# Patient Record
Sex: Male | Born: 1980
Health system: Southern US, Community
[De-identification: ages and names within clinical notes are randomized; demographics above are authoritative.]

---

## 2017-01-21 ENCOUNTER — Telehealth: Payer: Self-pay | Admitting: Family

## 2017-01-21 DIAGNOSIS — R6889 Other general symptoms and signs: Secondary | ICD-10-CM

## 2017-01-21 MED ORDER — OSELTAMIVIR PHOSPHATE 75 MG PO CAPS: 75.0000 mg | ORAL_CAPSULE | Freq: Two times a day (BID) | ORAL | 0 refills | Status: AC

## 2017-01-21 NOTE — Progress Notes (Signed)

## 2017-05-08 ENCOUNTER — Telehealth: Payer: Self-pay | Admitting: Family

## 2017-05-08 DIAGNOSIS — J029 Acute pharyngitis, unspecified: Secondary | ICD-10-CM

## 2017-05-08 MED ORDER — DOXYCYCLINE HYCLATE 100 MG PO TABS: 100.0000 mg | ORAL_TABLET | Freq: Two times a day (BID) | ORAL | 0 refills | Status: AC

## 2017-05-08 NOTE — Progress Notes (Signed)
We are sorry that you are not feeling well.  Here is how we plan to help!  Based on what you have shared with me it looks like you have pharyngitis.  Pharyngitis is inflammation and infection in the throat.  Based on your presentation I believe you most likely have Pharyngitis.  This is an infection caused by bacteria and is treated with antibiotics. I have prescribed Doxycycline 100mg  by mouth twice a day for 10 days. You may use an oral decongestant such as Mucinex D or if you have glaucoma or high blood pressure use plain Mucinex. Saline nasal spray help and can safely be used as often as needed for congestion.  If you develop worsening sinus pain, fever or notice severe headache and vision changes, or if symptoms are not better after completion of antibiotic, please schedule an appointment with a health care provider.    Remember to wash your hands thoroughly throughout the day as this is the number one way to prevent the spread of infection!  Home Care:  Only take medications as instructed by your medical team.  Complete the entire course of an antibiotic.  Do not take these medications with alcohol.  A steam or ultrasonic humidifier can help congestion.  You can place a towel over your head and breathe in the steam from hot water coming from a faucet.  Avoid close contacts especially the very young and the elderly.  Cover your mouth when you cough or sneeze.  Always remember to wash your hands.  Get Help Right Away If:  You develop worsening fever or sinus pain.  You develop a severe head ache or visual changes.  Your symptoms persist after you have completed your treatment plan.  Make sure you  Understand these instructions.  Will watch your condition.  Will get help right away if you are not doing well or get worse.  Your e-visit answers were reviewed by a board certified advanced clinical practitioner to complete your personal care plan.  Depending on the condition,  your plan could have included both over the counter or prescription medications.  If there is a problem please reply  once you have received a response from your provider.  Your safety is important to us.  If you have drug allergies check your prescription carefully.    You can use MyChart to ask questions about today's visit, request a non-urgent call back, or ask for a work or school excuse for 24 hours related to this e-Visit. If it has been greater than 24 hours you will need to follow up with your provider, or enter a new e-Visit to address those concerns.  You will get an e-mail in the next two days asking about your experience.  I hope that your e-visit has been valuable and will speed your recovery. Thank you for using e-visits.

## 2017-05-20 DIAGNOSIS — R070 Pain in throat: Secondary | ICD-10-CM | POA: Diagnosis not present

## 2017-05-20 DIAGNOSIS — J019 Acute sinusitis, unspecified: Secondary | ICD-10-CM | POA: Diagnosis not present

## 2017-08-27 DIAGNOSIS — R509 Fever, unspecified: Secondary | ICD-10-CM | POA: Diagnosis not present

## 2017-08-28 DIAGNOSIS — N410 Acute prostatitis: Secondary | ICD-10-CM | POA: Diagnosis not present

## 2017-08-28 DIAGNOSIS — R509 Fever, unspecified: Secondary | ICD-10-CM | POA: Diagnosis not present

## 2017-08-28 DIAGNOSIS — D72829 Elevated white blood cell count, unspecified: Secondary | ICD-10-CM | POA: Diagnosis not present

## 2017-09-04 DIAGNOSIS — D72829 Elevated white blood cell count, unspecified: Secondary | ICD-10-CM | POA: Diagnosis not present

## 2017-09-20 DIAGNOSIS — N410 Acute prostatitis: Secondary | ICD-10-CM | POA: Diagnosis not present

## 2018-05-08 DIAGNOSIS — R300 Dysuria: Secondary | ICD-10-CM | POA: Diagnosis not present

## 2018-05-08 DIAGNOSIS — N529 Male erectile dysfunction, unspecified: Secondary | ICD-10-CM | POA: Diagnosis not present

## 2018-05-08 DIAGNOSIS — R5383 Other fatigue: Secondary | ICD-10-CM | POA: Diagnosis not present

## 2018-05-17 ENCOUNTER — Telehealth: Payer: Self-pay | Admitting: Family

## 2018-05-17 DIAGNOSIS — B9689 Other specified bacterial agents as the cause of diseases classified elsewhere: Secondary | ICD-10-CM

## 2018-05-17 DIAGNOSIS — J028 Acute pharyngitis due to other specified organisms: Secondary | ICD-10-CM

## 2018-05-17 MED ORDER — AZITHROMYCIN 250 MG PO TABS: ORAL_TABLET | ORAL | 0 refills | Status: AC

## 2018-05-17 MED ORDER — BENZONATATE 100 MG PO CAPS: 100.0000 mg | ORAL_CAPSULE | Freq: Three times a day (TID) | ORAL | 0 refills | Status: AC | PRN

## 2018-05-17 MED ORDER — PREDNISONE 5 MG PO TABS: 5.0000 mg | ORAL_TABLET | ORAL | 0 refills | Status: AC

## 2018-05-17 NOTE — Progress Notes (Signed)
Thank you for the details you included in the comment boxes. Those details are very helpful in determining the best course of treatment for you and help [place] to provide the best care. Please stop using sinus rinses; these cause infections and worsen them. As far as being at work, the general rule is that you can go to work unless your fever is over 101.4 (approximately) and that you return to work 24 hours after the fever breaks. If your fever is below that, there is no guideline and it is really based on how you feel. Besides the courtesy of not coughing on others and perhaps wearing a mask, there is no reason to miss work unless you feel you cannot perform your job due to your illness. If that is the case, let us know and we can write you a work note.  We are sorry that you are not feeling well.  Here is how we plan to help!  Based on your presentation I believe you most likely have A cough due to bacteria.  When patients have a fever and a productive cough with a change in color or increased sputum production, we are concerned about bacterial bronchitis.  If left untreated it can progress to pneumonia.  If your symptoms do not improve with your treatment plan it is important that you contact your provider.   I have prescribed Azithromyin 250 mg: two tablets now and then one tablet daily for 4 additonal days    In addition you may use A non-prescription cough medication called Mucinex DM: take 2 tablets every 12 hours. and A prescription cough medication called Tessalon Perles 100mg . You may take 1-2 capsules every 8 hours as needed for your cough.  Prednisone 5 mg daily for 6 days (see taper instructions below)  Directions for 6 day taper: Day 1: 2 tablets before breakfast, 1 after both lunch & dinner and 2 at bedtime Day 2: 1 tab before breakfast, 1 after both lunch & dinner and 2 at bedtime Day 3: 1 tab at each meal & 1 at bedtime Day 4: 1 tab at breakfast, 1 at lunch, 1 at bedtime Day 5: 1 tab at  breakfast & 1 tab at bedtime Day 6: 1 tab at breakfast   From your responses in the eVisit questionnaire you describe inflammation in the upper respiratory tract which is causing a significant cough.  This is commonly called Bronchitis and has four common causes:    Allergies  Viral Infections  Acid Reflux  Bacterial Infection Allergies, viruses and acid reflux are treated by controlling symptoms or eliminating the cause. An example might be a cough caused by taking certain blood pressure medications. You stop the cough by changing the medication. Another example might be a cough caused by acid reflux. Controlling the reflux helps control the cough.  USE OF BRONCHODILATOR ("RESCUE") INHALERS: There is a risk from using your bronchodilator too frequently.  The risk is that over-reliance on a medication which only relaxes the muscles surrounding the breathing tubes can reduce the effectiveness of medications prescribed to reduce swelling and congestion of the tubes themselves.  Although you feel brief relief from the bronchodilator inhaler, your asthma may actually be worsening with the tubes becoming more swollen and filled with mucus.  This can delay other crucial treatments, such as oral steroid medications. If you need to use a bronchodilator inhaler daily, several times per day, you should discuss this with your provider.  There are probably better treatments  that could be used to keep your asthma under control.     HOME CARE . Only take medications as instructed by your medical team. . Complete the entire course of an antibiotic. . Drink plenty of fluids and get plenty of rest. . Avoid close contacts especially the very young and the elderly . Cover your mouth if you cough or cough into your sleeve. . Always remember to wash your hands . A steam or ultrasonic humidifier can help congestion.   GET HELP RIGHT AWAY IF: . You develop worsening fever. . You become short of breath . You  cough up blood. . Your symptoms persist after you have completed your treatment plan MAKE SURE YOU   Understand these instructions.  Will watch your condition.  Will get help right away if you are not doing well or get worse.  Your e-visit answers were reviewed by a board certified advanced clinical practitioner to complete your personal care plan.  Depending on the condition, your plan could have included both over the counter or prescription medications. If there is a problem please reply  once you have received a response from your provider. Your safety is important to us.  If you have drug allergies check your prescription carefully.    You can use MyChart to ask questions about today's visit, request a non-urgent call back, or ask for a work or school excuse for 24 hours related to this e-Visit. If it has been greater than 24 hours you will need to follow up with your provider, or enter a new e-Visit to address those concerns. You will get an e-mail in the next two days asking about your experience.  I hope that your e-visit has been valuable and will speed your recovery. Thank you for using e-visits.

## 2018-05-22 DIAGNOSIS — R748 Abnormal levels of other serum enzymes: Secondary | ICD-10-CM | POA: Diagnosis not present

## 2018-06-26 DIAGNOSIS — Z0000 Encounter for general adult medical examination without abnormal findings: Secondary | ICD-10-CM | POA: Diagnosis not present

## 2018-07-22 DIAGNOSIS — E7800 Pure hypercholesterolemia, unspecified: Secondary | ICD-10-CM | POA: Diagnosis not present

## 2018-07-22 DIAGNOSIS — Z0000 Encounter for general adult medical examination without abnormal findings: Secondary | ICD-10-CM | POA: Diagnosis not present

## 2018-11-12 DIAGNOSIS — M542 Cervicalgia: Secondary | ICD-10-CM | POA: Diagnosis not present

## 2018-11-12 DIAGNOSIS — J309 Allergic rhinitis, unspecified: Secondary | ICD-10-CM | POA: Diagnosis not present

## 2018-12-05 DIAGNOSIS — M542 Cervicalgia: Secondary | ICD-10-CM | POA: Diagnosis not present

## 2018-12-24 ENCOUNTER — Other Ambulatory Visit: Payer: Self-pay

## 2018-12-24 ENCOUNTER — Ambulatory Visit: Payer: BLUE CROSS/BLUE SHIELD | Attending: Orthopedic Surgery | Admitting: Physical Therapy

## 2018-12-24 ENCOUNTER — Encounter: Payer: Self-pay | Admitting: Physical Therapy

## 2018-12-24 DIAGNOSIS — M5412 Radiculopathy, cervical region: Secondary | ICD-10-CM

## 2018-12-24 DIAGNOSIS — M542 Cervicalgia: Secondary | ICD-10-CM

## 2018-12-24 NOTE — Therapy (Signed)
[facility] [address] [place], [place], [address] Phone: [phone]   Fax:  [phone]  Physical Therapy Evaluation  Patient Details  Name: Jeff KelchRobert A Guzman MRN: [id number] Date of Birth: 1981-06-11 Referring Provider (PT): Angelyn PuntNorris   Encounter Date: 12/24/2018  PT End of Session - 12/24/18 0929    Visit Number  1    Date for PT Re-Evaluation  02/22/19    PT Start Time  0847    PT Stop Time  0940    PT Time Calculation (min)  53 min    Activity Tolerance  Patient tolerated treatment well    Behavior During Therapy  [facility] for tasks assessed/performed       History reviewed. No pertinent past medical history.  History reviewed. No pertinent surgical history.  There were no vitals filed for this visit.   Subjective Assessment - 12/24/18 0856    Subjective  Patient reports that he has had some neck pain off and on but over the past 4 months he reports that he has had increased pain and pain in the left arm is new.  X-rays show DDD and bone spurs.    Limitations  Lifting;Sitting;Reading    Patient Stated Goals  have less pain    Currently in Pain?  Yes    Pain Score  4     Pain Location  Neck    Pain Orientation  Left    Pain Descriptors / Indicators  Aching;Tightness;Pins and needles;Sharp    Pain Type  Acute pain    Pain Radiating Towards  pain, numbness into the firts 3 fingers    Pain Onset  More than a month ago    Pain Frequency  Constant    Aggravating Factors   reaching, lifting head motions, pain up to 8-9/10    Pain Relieving Factors  tramadol, massage, reports that he sees a chiropractor at best pain a 3-4/10    Effect of Pain on Daily Activities  just hurts doing anything         [facility] - 12/24/18 0001      Assessment   Medical Diagnosis  cervical radiculopathy    Referring Provider (PT)  Norris    Onset Date/Surgical Date  11/23/18    Hand Dominance  Right      Precautions   Precautions  None      Balance Screen   Has the patient fallen in the past 6 months  No    Has the patient had a decrease in activity level because of a fear of falling?   No    Is the patient reluctant to leave their home because of a fear of falling?   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, some computer work    Leisure  was going to the gym 1-2x/week      Posture/Postural Control   Posture Comments  fwd head      ROM / Strength   AROM / PROM / Strength  AROM;Strength      AROM   Overall AROM Comments  Cervical ROM decreased 25% for flexion, decreased 75% for extension with increased  pain down the left arm, cervical rotaiton decreased 50% with c/o tightness, side bending decreased 25%, shoulder ROM WFL's      Strength   Overall Strength Comments  5/5 without pain      Palpation   Palpation comment  tender and tight with knots in the left cervical area, the left upper trap, he is tender in the left forearm as well      Special Tests   Other special tests  some neural tension in the bilateral UE's                Objective measurements completed on examination: See above findings.      [facility] Adult PT Treatment/Exercise - 12/24/18 0001      Modalities   Modalities  Traction      Traction   Type of Traction  Cervical    Max (lbs)  16    Hold Time  static    Time  14             PT Education - 12/24/18 1348    Education Details  spoke with him about posture    Person(s) Educated  Patient    Methods  Explanation    Comprehension  Verbalized understanding       PT Short Term Goals - 12/24/18 1350      PT SHORT TERM GOAL #1   Title  independent with initial HEP    Time  2    Period  Weeks    Status   New        PT Long Term Goals - 12/24/18 1350      PT LONG TERM GOAL #1   Title  decrease pain 50%    Time  8    Period  Weeks    Status  New      PT LONG TERM GOAL #2   Title  increase ROM 25%    Time  8    Period  Weeks    Status  New      PT LONG TERM GOAL #3   Title  decrease radicular symptoms 25%    Time  8    Period  Weeks    Status  New      PT LONG TERM GOAL #4   Title  understand posture and body mechanics    Time  8    Period  Weeks    Status  New             Plan - 12/24/18 0930    Clinical Impression Statement  Patient with neck pain for a number of years, he reports that over the past 4-6 months he has had increased pain and now radicular symptoms down the left arm.  X-rays show DDD and bone spurs.  He has limitation in cervical ROM especially extension which increased neck pain and left arm pain.    Clinical Presentation  Evolving    Clinical Decision Making  Low    Rehab Potential  Good    PT Frequency  2x / week    PT Duration  8 weeks    PT Treatment/Interventions  ADLs/Self Care Home Management;Cryotherapy;Electrical Stimulation;Moist Heat;Traction;Ultrasound;Therapeutic activities;Therapeutic exercise;Neuromuscular re-education;Patient/family education;Manual techniques;Dry needling    PT Next Visit Plan  start scapular stabilization, could try modalities.  see how traction did    Consulted and Agree with Plan of Care  Patient       Patient will benefit from skilled therapeutic intervention  in order to improve the following deficits and impairments:  Improper body mechanics, Pain, Postural dysfunction, Increased muscle spasms, Decreased range of motion, Impaired UE functional use, Impaired flexibility  Visit Diagnosis: Radiculopathy, cervical region - Plan: PT plan of care cert/re-cert  Cervicalgia - Plan: PT plan of care cert/re-cert     Problem List There are no active problems to display for this patient.   Jearld LeschALBRIGHT,[name],  PT 12/24/2018, 2:00 PM  [facility] [address] [place], [place], [address] Phone: [phone]   Fax:  [phone]  Name: Jeff Guzman MRN: [id number] Date of Birth: Apr 26, 1981

## 2019-01-03 ENCOUNTER — Ambulatory Visit: Payer: BLUE CROSS/BLUE SHIELD | Admitting: Physical Therapy

## 2019-01-08 DIAGNOSIS — M9901 Segmental and somatic dysfunction of cervical region: Secondary | ICD-10-CM | POA: Diagnosis not present

## 2019-01-08 DIAGNOSIS — M4802 Spinal stenosis, cervical region: Secondary | ICD-10-CM | POA: Diagnosis not present

## 2019-11-27 DIAGNOSIS — Z20828 Contact with and (suspected) exposure to other viral communicable diseases: Secondary | ICD-10-CM | POA: Diagnosis not present

## 2020-01-06 DIAGNOSIS — Z0000 Encounter for general adult medical examination without abnormal findings: Secondary | ICD-10-CM | POA: Diagnosis not present

## 2020-01-06 DIAGNOSIS — K219 Gastro-esophageal reflux disease without esophagitis: Secondary | ICD-10-CM | POA: Diagnosis not present

## 2020-01-06 DIAGNOSIS — E7800 Pure hypercholesterolemia, unspecified: Secondary | ICD-10-CM | POA: Diagnosis not present

## 2020-01-07 DIAGNOSIS — Z125 Encounter for screening for malignant neoplasm of prostate: Secondary | ICD-10-CM | POA: Diagnosis not present

## 2020-01-07 DIAGNOSIS — I10 Essential (primary) hypertension: Secondary | ICD-10-CM | POA: Diagnosis not present

## 2020-01-07 DIAGNOSIS — N410 Acute prostatitis: Secondary | ICD-10-CM | POA: Diagnosis not present

## 2020-01-07 DIAGNOSIS — E7800 Pure hypercholesterolemia, unspecified: Secondary | ICD-10-CM | POA: Diagnosis not present

## 2020-01-14 DIAGNOSIS — M7541 Impingement syndrome of right shoulder: Secondary | ICD-10-CM | POA: Diagnosis not present

## 2020-01-14 DIAGNOSIS — M25511 Pain in right shoulder: Secondary | ICD-10-CM | POA: Diagnosis not present

## 2020-02-17 DIAGNOSIS — F329 Major depressive disorder, single episode, unspecified: Secondary | ICD-10-CM | POA: Diagnosis not present

## 2020-02-17 DIAGNOSIS — I10 Essential (primary) hypertension: Secondary | ICD-10-CM | POA: Diagnosis not present

## 2021-03-24 DIAGNOSIS — J301 Allergic rhinitis due to pollen: Secondary | ICD-10-CM | POA: Diagnosis not present

## 2021-03-24 DIAGNOSIS — L501 Idiopathic urticaria: Secondary | ICD-10-CM | POA: Diagnosis not present

## 2021-04-06 DIAGNOSIS — Z0000 Encounter for general adult medical examination without abnormal findings: Secondary | ICD-10-CM | POA: Diagnosis not present

## 2021-04-13 DIAGNOSIS — I10 Essential (primary) hypertension: Secondary | ICD-10-CM | POA: Diagnosis not present

## 2021-04-13 DIAGNOSIS — E7800 Pure hypercholesterolemia, unspecified: Secondary | ICD-10-CM | POA: Diagnosis not present

## 2021-04-13 DIAGNOSIS — Z0000 Encounter for general adult medical examination without abnormal findings: Secondary | ICD-10-CM | POA: Diagnosis not present

## 2021-04-13 DIAGNOSIS — K219 Gastro-esophageal reflux disease without esophagitis: Secondary | ICD-10-CM | POA: Diagnosis not present

## 2021-04-13 DIAGNOSIS — F339 Major depressive disorder, recurrent, unspecified: Secondary | ICD-10-CM | POA: Diagnosis not present

## 2021-04-14 ENCOUNTER — Other Ambulatory Visit: Payer: Self-pay | Admitting: Internal Medicine

## 2021-04-14 DIAGNOSIS — E7800 Pure hypercholesterolemia, unspecified: Secondary | ICD-10-CM

## 2021-05-04 DIAGNOSIS — J301 Allergic rhinitis due to pollen: Secondary | ICD-10-CM | POA: Diagnosis not present

## 2021-05-05 ENCOUNTER — Ambulatory Visit
Admission: RE | Admit: 2021-05-05 | Discharge: 2021-05-05 | Disposition: A | Discharge status: Home / Self Care | Payer: 59 | Source: Ambulatory Visit | Attending: Internal Medicine | Admitting: Internal Medicine

## 2021-05-05 DIAGNOSIS — E7800 Pure hypercholesterolemia, unspecified: Secondary | ICD-10-CM

## 2021-05-06 DIAGNOSIS — Z20822 Contact with and (suspected) exposure to covid-19: Secondary | ICD-10-CM | POA: Diagnosis not present

## 2021-05-18 DIAGNOSIS — J301 Allergic rhinitis due to pollen: Secondary | ICD-10-CM | POA: Diagnosis not present

## 2021-05-18 DIAGNOSIS — I10 Essential (primary) hypertension: Secondary | ICD-10-CM | POA: Diagnosis not present

## 2021-05-18 DIAGNOSIS — J8410 Pulmonary fibrosis, unspecified: Secondary | ICD-10-CM | POA: Diagnosis not present

## 2021-05-18 DIAGNOSIS — Z789 Other specified health status: Secondary | ICD-10-CM | POA: Diagnosis not present

## 2021-05-25 DIAGNOSIS — J301 Allergic rhinitis due to pollen: Secondary | ICD-10-CM | POA: Diagnosis not present

## 2021-06-01 DIAGNOSIS — J301 Allergic rhinitis due to pollen: Secondary | ICD-10-CM | POA: Diagnosis not present

## 2021-06-01 DIAGNOSIS — J3089 Other allergic rhinitis: Secondary | ICD-10-CM | POA: Diagnosis not present

## 2021-06-08 DIAGNOSIS — J301 Allergic rhinitis due to pollen: Secondary | ICD-10-CM | POA: Diagnosis not present

## 2021-06-17 DIAGNOSIS — J301 Allergic rhinitis due to pollen: Secondary | ICD-10-CM | POA: Diagnosis not present

## 2021-06-21 DIAGNOSIS — J301 Allergic rhinitis due to pollen: Secondary | ICD-10-CM | POA: Diagnosis not present

## 2021-06-21 DIAGNOSIS — J3089 Other allergic rhinitis: Secondary | ICD-10-CM | POA: Diagnosis not present

## 2021-07-01 DIAGNOSIS — J301 Allergic rhinitis due to pollen: Secondary | ICD-10-CM | POA: Diagnosis not present

## 2021-07-07 DIAGNOSIS — J301 Allergic rhinitis due to pollen: Secondary | ICD-10-CM | POA: Diagnosis not present

## 2021-07-12 DIAGNOSIS — J301 Allergic rhinitis due to pollen: Secondary | ICD-10-CM | POA: Diagnosis not present

## 2021-07-14 DIAGNOSIS — J301 Allergic rhinitis due to pollen: Secondary | ICD-10-CM | POA: Diagnosis not present

## 2021-07-22 DIAGNOSIS — J301 Allergic rhinitis due to pollen: Secondary | ICD-10-CM | POA: Diagnosis not present

## 2021-07-27 DIAGNOSIS — J301 Allergic rhinitis due to pollen: Secondary | ICD-10-CM | POA: Diagnosis not present

## 2021-08-03 DIAGNOSIS — J029 Acute pharyngitis, unspecified: Secondary | ICD-10-CM | POA: Diagnosis not present

## 2021-08-03 DIAGNOSIS — R059 Cough, unspecified: Secondary | ICD-10-CM | POA: Diagnosis not present

## 2021-08-03 DIAGNOSIS — Z20822 Contact with and (suspected) exposure to covid-19: Secondary | ICD-10-CM | POA: Diagnosis not present

## 2021-08-04 DIAGNOSIS — R519 Headache, unspecified: Secondary | ICD-10-CM | POA: Diagnosis not present

## 2021-08-04 DIAGNOSIS — M9902 Segmental and somatic dysfunction of thoracic region: Secondary | ICD-10-CM | POA: Diagnosis not present

## 2021-08-04 DIAGNOSIS — M6210 Other rupture of muscle (nontraumatic), unspecified site: Secondary | ICD-10-CM | POA: Diagnosis not present

## 2021-08-04 DIAGNOSIS — J301 Allergic rhinitis due to pollen: Secondary | ICD-10-CM | POA: Diagnosis not present

## 2021-08-04 DIAGNOSIS — M9901 Segmental and somatic dysfunction of cervical region: Secondary | ICD-10-CM | POA: Diagnosis not present

## 2021-08-10 DIAGNOSIS — M9901 Segmental and somatic dysfunction of cervical region: Secondary | ICD-10-CM | POA: Diagnosis not present

## 2021-08-10 DIAGNOSIS — J301 Allergic rhinitis due to pollen: Secondary | ICD-10-CM | POA: Diagnosis not present

## 2021-08-10 DIAGNOSIS — R519 Headache, unspecified: Secondary | ICD-10-CM | POA: Diagnosis not present

## 2021-08-10 DIAGNOSIS — M6210 Other rupture of muscle (nontraumatic), unspecified site: Secondary | ICD-10-CM | POA: Diagnosis not present

## 2021-08-10 DIAGNOSIS — M9902 Segmental and somatic dysfunction of thoracic region: Secondary | ICD-10-CM | POA: Diagnosis not present

## 2021-08-15 DIAGNOSIS — J301 Allergic rhinitis due to pollen: Secondary | ICD-10-CM | POA: Diagnosis not present

## 2021-08-16 DIAGNOSIS — R519 Headache, unspecified: Secondary | ICD-10-CM | POA: Diagnosis not present

## 2021-08-16 DIAGNOSIS — M6210 Other rupture of muscle (nontraumatic), unspecified site: Secondary | ICD-10-CM | POA: Diagnosis not present

## 2021-08-16 DIAGNOSIS — M9901 Segmental and somatic dysfunction of cervical region: Secondary | ICD-10-CM | POA: Diagnosis not present

## 2021-08-16 DIAGNOSIS — M9902 Segmental and somatic dysfunction of thoracic region: Secondary | ICD-10-CM | POA: Diagnosis not present

## 2021-08-19 DIAGNOSIS — J301 Allergic rhinitis due to pollen: Secondary | ICD-10-CM | POA: Diagnosis not present

## 2021-08-25 DIAGNOSIS — M9902 Segmental and somatic dysfunction of thoracic region: Secondary | ICD-10-CM | POA: Diagnosis not present

## 2021-08-25 DIAGNOSIS — R519 Headache, unspecified: Secondary | ICD-10-CM | POA: Diagnosis not present

## 2021-08-25 DIAGNOSIS — M9901 Segmental and somatic dysfunction of cervical region: Secondary | ICD-10-CM | POA: Diagnosis not present

## 2021-08-25 DIAGNOSIS — J301 Allergic rhinitis due to pollen: Secondary | ICD-10-CM | POA: Diagnosis not present

## 2021-08-25 DIAGNOSIS — M6210 Other rupture of muscle (nontraumatic), unspecified site: Secondary | ICD-10-CM | POA: Diagnosis not present

## 2021-08-30 DIAGNOSIS — J301 Allergic rhinitis due to pollen: Secondary | ICD-10-CM | POA: Diagnosis not present

## 2021-09-02 DIAGNOSIS — J301 Allergic rhinitis due to pollen: Secondary | ICD-10-CM | POA: Diagnosis not present

## 2021-09-07 DIAGNOSIS — R519 Headache, unspecified: Secondary | ICD-10-CM | POA: Diagnosis not present

## 2021-09-07 DIAGNOSIS — J301 Allergic rhinitis due to pollen: Secondary | ICD-10-CM | POA: Diagnosis not present

## 2021-09-07 DIAGNOSIS — M6210 Other rupture of muscle (nontraumatic), unspecified site: Secondary | ICD-10-CM | POA: Diagnosis not present

## 2021-09-07 DIAGNOSIS — M9902 Segmental and somatic dysfunction of thoracic region: Secondary | ICD-10-CM | POA: Diagnosis not present

## 2021-09-07 DIAGNOSIS — M9901 Segmental and somatic dysfunction of cervical region: Secondary | ICD-10-CM | POA: Diagnosis not present

## 2021-09-09 DIAGNOSIS — J301 Allergic rhinitis due to pollen: Secondary | ICD-10-CM | POA: Diagnosis not present

## 2021-09-13 DIAGNOSIS — J301 Allergic rhinitis due to pollen: Secondary | ICD-10-CM | POA: Diagnosis not present

## 2021-09-15 DIAGNOSIS — M791 Myalgia, unspecified site: Secondary | ICD-10-CM | POA: Diagnosis not present

## 2021-09-15 DIAGNOSIS — M25552 Pain in left hip: Secondary | ICD-10-CM | POA: Diagnosis not present

## 2021-09-15 DIAGNOSIS — M9901 Segmental and somatic dysfunction of cervical region: Secondary | ICD-10-CM | POA: Diagnosis not present

## 2021-09-15 DIAGNOSIS — R519 Headache, unspecified: Secondary | ICD-10-CM | POA: Diagnosis not present

## 2021-09-19 DIAGNOSIS — J301 Allergic rhinitis due to pollen: Secondary | ICD-10-CM | POA: Diagnosis not present

## 2021-09-22 DIAGNOSIS — J301 Allergic rhinitis due to pollen: Secondary | ICD-10-CM | POA: Diagnosis not present

## 2021-09-28 DIAGNOSIS — N411 Chronic prostatitis: Secondary | ICD-10-CM | POA: Diagnosis not present

## 2021-09-28 DIAGNOSIS — E349 Endocrine disorder, unspecified: Secondary | ICD-10-CM | POA: Diagnosis not present

## 2021-09-28 DIAGNOSIS — N5201 Erectile dysfunction due to arterial insufficiency: Secondary | ICD-10-CM | POA: Diagnosis not present

## 2021-09-29 DIAGNOSIS — J301 Allergic rhinitis due to pollen: Secondary | ICD-10-CM | POA: Diagnosis not present

## 2021-10-04 DIAGNOSIS — M9901 Segmental and somatic dysfunction of cervical region: Secondary | ICD-10-CM | POA: Diagnosis not present

## 2021-10-04 DIAGNOSIS — M25552 Pain in left hip: Secondary | ICD-10-CM | POA: Diagnosis not present

## 2021-10-04 DIAGNOSIS — R519 Headache, unspecified: Secondary | ICD-10-CM | POA: Diagnosis not present

## 2021-10-04 DIAGNOSIS — M791 Myalgia, unspecified site: Secondary | ICD-10-CM | POA: Diagnosis not present

## 2021-10-04 DIAGNOSIS — E349 Endocrine disorder, unspecified: Secondary | ICD-10-CM | POA: Diagnosis not present

## 2021-10-05 DIAGNOSIS — J301 Allergic rhinitis due to pollen: Secondary | ICD-10-CM | POA: Diagnosis not present

## 2021-10-10 DIAGNOSIS — M25552 Pain in left hip: Secondary | ICD-10-CM | POA: Diagnosis not present

## 2021-10-10 DIAGNOSIS — J3089 Other allergic rhinitis: Secondary | ICD-10-CM | POA: Diagnosis not present

## 2021-10-10 DIAGNOSIS — J301 Allergic rhinitis due to pollen: Secondary | ICD-10-CM | POA: Diagnosis not present

## 2021-10-10 DIAGNOSIS — R519 Headache, unspecified: Secondary | ICD-10-CM | POA: Diagnosis not present

## 2021-10-10 DIAGNOSIS — M791 Myalgia, unspecified site: Secondary | ICD-10-CM | POA: Diagnosis not present

## 2021-10-10 DIAGNOSIS — M9901 Segmental and somatic dysfunction of cervical region: Secondary | ICD-10-CM | POA: Diagnosis not present

## 2021-10-20 DIAGNOSIS — M9901 Segmental and somatic dysfunction of cervical region: Secondary | ICD-10-CM | POA: Diagnosis not present

## 2021-10-20 DIAGNOSIS — M9903 Segmental and somatic dysfunction of lumbar region: Secondary | ICD-10-CM | POA: Diagnosis not present

## 2021-10-20 DIAGNOSIS — M9902 Segmental and somatic dysfunction of thoracic region: Secondary | ICD-10-CM | POA: Diagnosis not present

## 2021-10-20 DIAGNOSIS — J301 Allergic rhinitis due to pollen: Secondary | ICD-10-CM | POA: Diagnosis not present

## 2021-10-20 DIAGNOSIS — G5603 Carpal tunnel syndrome, bilateral upper limbs: Secondary | ICD-10-CM | POA: Diagnosis not present

## 2021-10-28 DIAGNOSIS — J301 Allergic rhinitis due to pollen: Secondary | ICD-10-CM | POA: Diagnosis not present

## 2021-10-28 DIAGNOSIS — J3089 Other allergic rhinitis: Secondary | ICD-10-CM | POA: Diagnosis not present

## 2021-11-01 DIAGNOSIS — E349 Endocrine disorder, unspecified: Secondary | ICD-10-CM | POA: Diagnosis not present

## 2021-11-01 DIAGNOSIS — N5201 Erectile dysfunction due to arterial insufficiency: Secondary | ICD-10-CM | POA: Diagnosis not present

## 2021-11-01 DIAGNOSIS — J301 Allergic rhinitis due to pollen: Secondary | ICD-10-CM | POA: Diagnosis not present

## 2021-11-09 DIAGNOSIS — M9901 Segmental and somatic dysfunction of cervical region: Secondary | ICD-10-CM | POA: Diagnosis not present

## 2021-11-09 DIAGNOSIS — M9902 Segmental and somatic dysfunction of thoracic region: Secondary | ICD-10-CM | POA: Diagnosis not present

## 2021-11-09 DIAGNOSIS — G5603 Carpal tunnel syndrome, bilateral upper limbs: Secondary | ICD-10-CM | POA: Diagnosis not present

## 2021-11-09 DIAGNOSIS — M9903 Segmental and somatic dysfunction of lumbar region: Secondary | ICD-10-CM | POA: Diagnosis not present

## 2021-11-09 DIAGNOSIS — J301 Allergic rhinitis due to pollen: Secondary | ICD-10-CM | POA: Diagnosis not present

## 2021-11-17 DIAGNOSIS — J301 Allergic rhinitis due to pollen: Secondary | ICD-10-CM | POA: Diagnosis not present

## 2021-11-22 DIAGNOSIS — M9903 Segmental and somatic dysfunction of lumbar region: Secondary | ICD-10-CM | POA: Diagnosis not present

## 2021-11-22 DIAGNOSIS — M9902 Segmental and somatic dysfunction of thoracic region: Secondary | ICD-10-CM | POA: Diagnosis not present

## 2021-11-22 DIAGNOSIS — M9901 Segmental and somatic dysfunction of cervical region: Secondary | ICD-10-CM | POA: Diagnosis not present

## 2021-11-22 DIAGNOSIS — G5603 Carpal tunnel syndrome, bilateral upper limbs: Secondary | ICD-10-CM | POA: Diagnosis not present

## 2021-11-22 DIAGNOSIS — J301 Allergic rhinitis due to pollen: Secondary | ICD-10-CM | POA: Diagnosis not present

## 2021-11-24 DIAGNOSIS — J301 Allergic rhinitis due to pollen: Secondary | ICD-10-CM | POA: Diagnosis not present

## 2021-11-29 DIAGNOSIS — J301 Allergic rhinitis due to pollen: Secondary | ICD-10-CM | POA: Diagnosis not present

## 2021-12-01 DIAGNOSIS — J301 Allergic rhinitis due to pollen: Secondary | ICD-10-CM | POA: Diagnosis not present

## 2021-12-08 DIAGNOSIS — J301 Allergic rhinitis due to pollen: Secondary | ICD-10-CM | POA: Diagnosis not present

## 2021-12-15 DIAGNOSIS — J301 Allergic rhinitis due to pollen: Secondary | ICD-10-CM | POA: Diagnosis not present

## 2021-12-23 DIAGNOSIS — J301 Allergic rhinitis due to pollen: Secondary | ICD-10-CM | POA: Diagnosis not present

## 2021-12-30 DIAGNOSIS — J301 Allergic rhinitis due to pollen: Secondary | ICD-10-CM | POA: Diagnosis not present

## 2022-01-06 DIAGNOSIS — J301 Allergic rhinitis due to pollen: Secondary | ICD-10-CM | POA: Diagnosis not present

## 2022-01-12 DIAGNOSIS — J301 Allergic rhinitis due to pollen: Secondary | ICD-10-CM | POA: Diagnosis not present

## 2022-01-13 DIAGNOSIS — J301 Allergic rhinitis due to pollen: Secondary | ICD-10-CM | POA: Diagnosis not present

## 2022-01-20 DIAGNOSIS — J301 Allergic rhinitis due to pollen: Secondary | ICD-10-CM | POA: Diagnosis not present

## 2022-01-26 DIAGNOSIS — J301 Allergic rhinitis due to pollen: Secondary | ICD-10-CM | POA: Diagnosis not present

## 2022-02-01 DIAGNOSIS — J301 Allergic rhinitis due to pollen: Secondary | ICD-10-CM | POA: Diagnosis not present

## 2022-02-07 DIAGNOSIS — J301 Allergic rhinitis due to pollen: Secondary | ICD-10-CM | POA: Diagnosis not present

## 2022-02-16 DIAGNOSIS — J301 Allergic rhinitis due to pollen: Secondary | ICD-10-CM | POA: Diagnosis not present

## 2022-03-01 DIAGNOSIS — J301 Allergic rhinitis due to pollen: Secondary | ICD-10-CM | POA: Diagnosis not present

## 2022-03-07 DIAGNOSIS — J3089 Other allergic rhinitis: Secondary | ICD-10-CM | POA: Diagnosis not present

## 2022-03-07 DIAGNOSIS — J301 Allergic rhinitis due to pollen: Secondary | ICD-10-CM | POA: Diagnosis not present

## 2022-03-07 DIAGNOSIS — J3081 Allergic rhinitis due to animal (cat) (dog) hair and dander: Secondary | ICD-10-CM | POA: Diagnosis not present

## 2022-03-27 DIAGNOSIS — J301 Allergic rhinitis due to pollen: Secondary | ICD-10-CM | POA: Diagnosis not present

## 2022-04-14 DIAGNOSIS — J301 Allergic rhinitis due to pollen: Secondary | ICD-10-CM | POA: Diagnosis not present

## 2022-04-27 DIAGNOSIS — J3081 Allergic rhinitis due to animal (cat) (dog) hair and dander: Secondary | ICD-10-CM | POA: Diagnosis not present

## 2022-04-27 DIAGNOSIS — J3089 Other allergic rhinitis: Secondary | ICD-10-CM | POA: Diagnosis not present

## 2022-04-27 DIAGNOSIS — J301 Allergic rhinitis due to pollen: Secondary | ICD-10-CM | POA: Diagnosis not present

## 2022-05-04 DIAGNOSIS — J3089 Other allergic rhinitis: Secondary | ICD-10-CM | POA: Diagnosis not present

## 2022-05-04 DIAGNOSIS — J301 Allergic rhinitis due to pollen: Secondary | ICD-10-CM | POA: Diagnosis not present

## 2022-05-04 DIAGNOSIS — J3081 Allergic rhinitis due to animal (cat) (dog) hair and dander: Secondary | ICD-10-CM | POA: Diagnosis not present

## 2022-05-10 DIAGNOSIS — J3081 Allergic rhinitis due to animal (cat) (dog) hair and dander: Secondary | ICD-10-CM | POA: Diagnosis not present

## 2022-05-10 DIAGNOSIS — J301 Allergic rhinitis due to pollen: Secondary | ICD-10-CM | POA: Diagnosis not present

## 2022-05-10 DIAGNOSIS — J3089 Other allergic rhinitis: Secondary | ICD-10-CM | POA: Diagnosis not present

## 2022-05-26 DIAGNOSIS — J3089 Other allergic rhinitis: Secondary | ICD-10-CM | POA: Diagnosis not present

## 2022-05-26 DIAGNOSIS — J301 Allergic rhinitis due to pollen: Secondary | ICD-10-CM | POA: Diagnosis not present

## 2022-06-05 DIAGNOSIS — Z0000 Encounter for general adult medical examination without abnormal findings: Secondary | ICD-10-CM | POA: Diagnosis not present

## 2022-06-12 DIAGNOSIS — E7800 Pure hypercholesterolemia, unspecified: Secondary | ICD-10-CM | POA: Diagnosis not present

## 2022-06-12 DIAGNOSIS — Z0000 Encounter for general adult medical examination without abnormal findings: Secondary | ICD-10-CM | POA: Diagnosis not present

## 2022-06-12 DIAGNOSIS — Z6833 Body mass index (BMI) 33.0-33.9, adult: Secondary | ICD-10-CM | POA: Diagnosis not present

## 2022-06-12 DIAGNOSIS — E6609 Other obesity due to excess calories: Secondary | ICD-10-CM | POA: Diagnosis not present

## 2022-06-16 DIAGNOSIS — J3081 Allergic rhinitis due to animal (cat) (dog) hair and dander: Secondary | ICD-10-CM | POA: Diagnosis not present

## 2022-06-16 DIAGNOSIS — J3089 Other allergic rhinitis: Secondary | ICD-10-CM | POA: Diagnosis not present

## 2022-06-16 DIAGNOSIS — J301 Allergic rhinitis due to pollen: Secondary | ICD-10-CM | POA: Diagnosis not present

## 2022-07-13 DIAGNOSIS — J3089 Other allergic rhinitis: Secondary | ICD-10-CM | POA: Diagnosis not present

## 2022-07-13 DIAGNOSIS — J3081 Allergic rhinitis due to animal (cat) (dog) hair and dander: Secondary | ICD-10-CM | POA: Diagnosis not present

## 2022-07-13 DIAGNOSIS — J301 Allergic rhinitis due to pollen: Secondary | ICD-10-CM | POA: Diagnosis not present

## 2022-07-27 DIAGNOSIS — J3081 Allergic rhinitis due to animal (cat) (dog) hair and dander: Secondary | ICD-10-CM | POA: Diagnosis not present

## 2022-07-27 DIAGNOSIS — J3089 Other allergic rhinitis: Secondary | ICD-10-CM | POA: Diagnosis not present

## 2022-07-27 DIAGNOSIS — J301 Allergic rhinitis due to pollen: Secondary | ICD-10-CM | POA: Diagnosis not present

## 2022-08-04 DIAGNOSIS — J3089 Other allergic rhinitis: Secondary | ICD-10-CM | POA: Diagnosis not present

## 2022-08-04 DIAGNOSIS — J3081 Allergic rhinitis due to animal (cat) (dog) hair and dander: Secondary | ICD-10-CM | POA: Diagnosis not present

## 2022-08-04 DIAGNOSIS — J301 Allergic rhinitis due to pollen: Secondary | ICD-10-CM | POA: Diagnosis not present

## 2022-08-11 DIAGNOSIS — J301 Allergic rhinitis due to pollen: Secondary | ICD-10-CM | POA: Diagnosis not present

## 2022-08-11 DIAGNOSIS — J3089 Other allergic rhinitis: Secondary | ICD-10-CM | POA: Diagnosis not present

## 2022-08-11 DIAGNOSIS — J3081 Allergic rhinitis due to animal (cat) (dog) hair and dander: Secondary | ICD-10-CM | POA: Diagnosis not present

## 2022-09-18 IMAGING — CT CT CARDIAC CORONARY ARTERY CALCIUM SCORE
3 series · 14 of 20 positions shown, 16 images · non-contrast
Comparison: None.

CLINICAL DATA: 39-year-old white male with hypercholesterolemia.

EXAM:
CT CARDIAC CORONARY ARTERY CALCIUM SCORE
TECHNIQUE: Non-contrast imaging through the heart was performed using
prospective ECG gating. Image post processing was performed on an
independent workstation, allowing for quantitative analysis of the
heart and coronary arteries. Note that this exam targets the heart
and the chest was not imaged in its entirety.

[Series 2: calcium scoring 2.00 qr36 bestdiast 70% hrt calciu · axial · 0.51mm/px · z∈[+1583,+1669]mm · 4 of 73 slices shown]
[im 15/73  vessel]
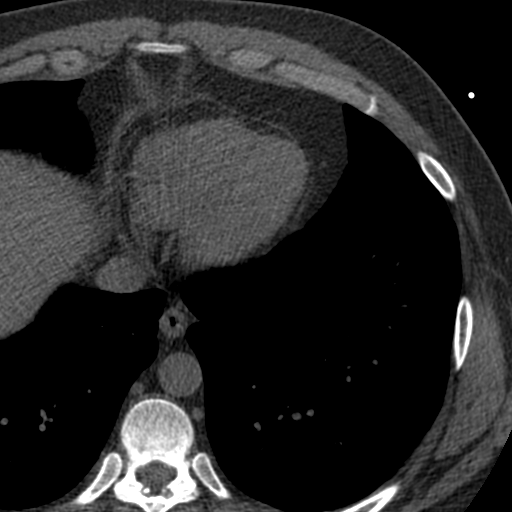
[im 29/73  vessel]
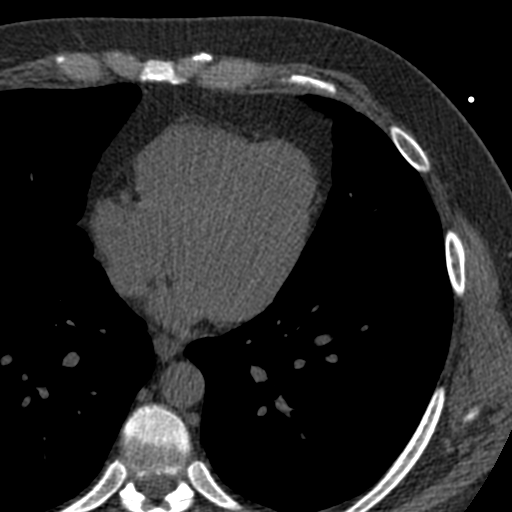
[im 44/73  vessel]
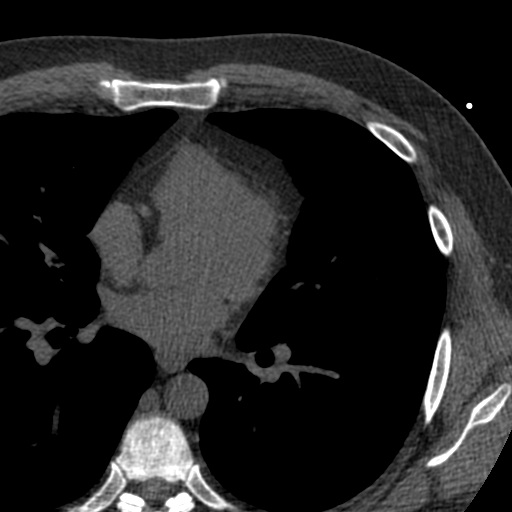
[im 58/73  vessel]
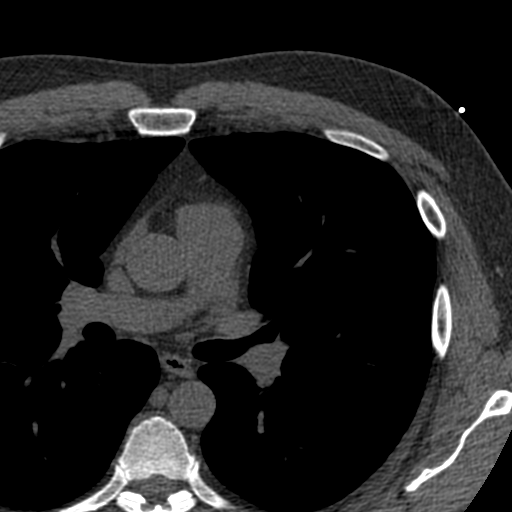

[Series 3: calcium scoring 2.00 br40 bestdiast 70% axial · axial · 0.66mm/px · z∈[+1579,+1675]mm · 5 of 73 slices shown, 7 images]
[im 13/73  vessel]
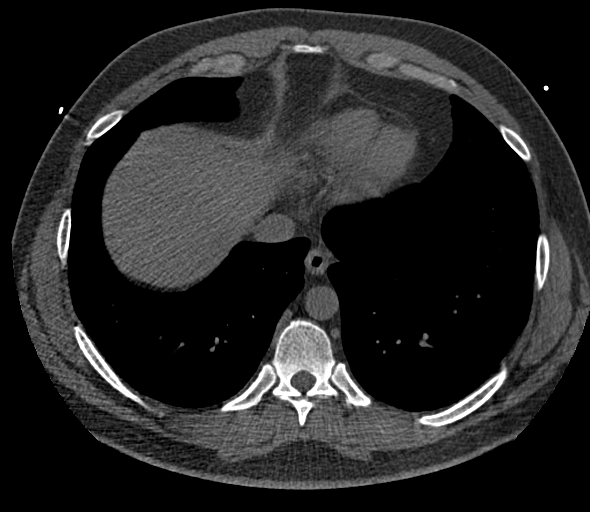
[im 13/73  lung]
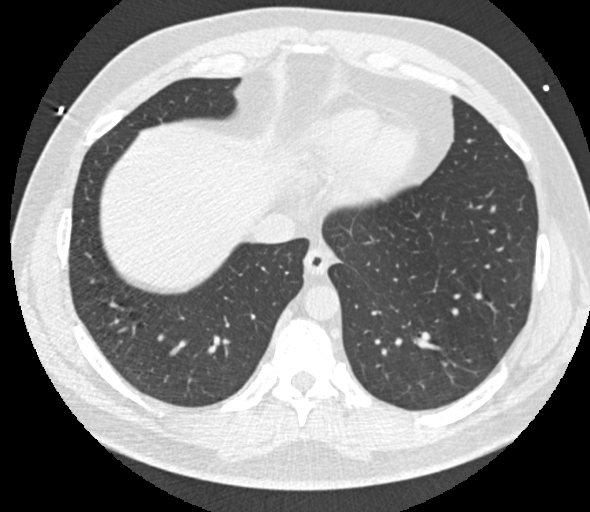
[im 25/73  vessel]
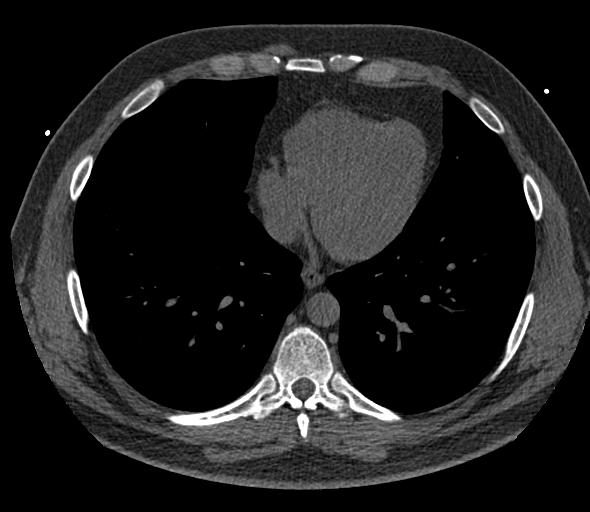
[im 37/73  vessel]
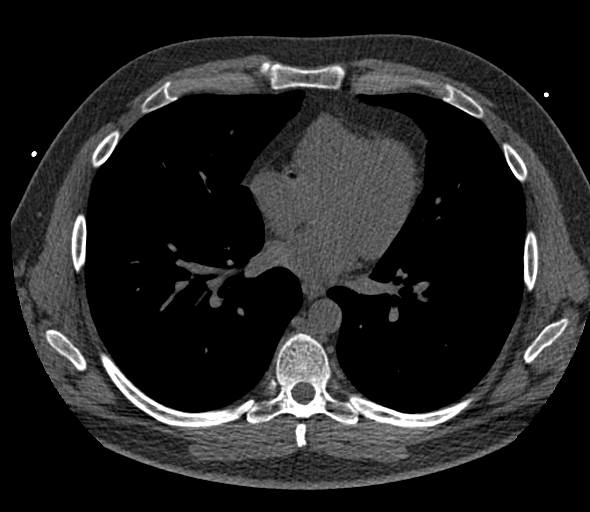
[im 49/73  vessel]
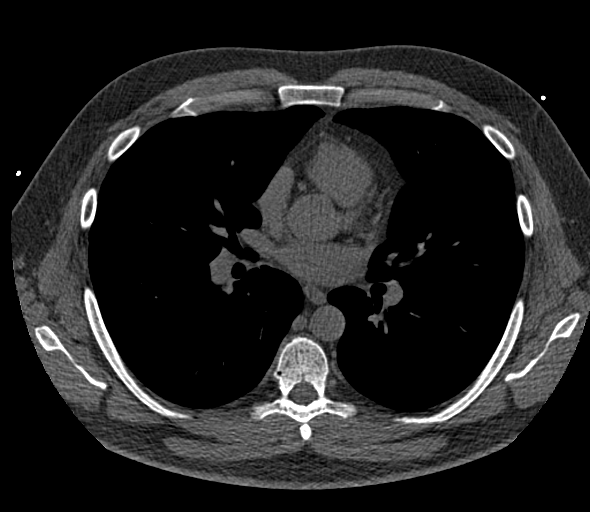
[im 61/73  vessel]
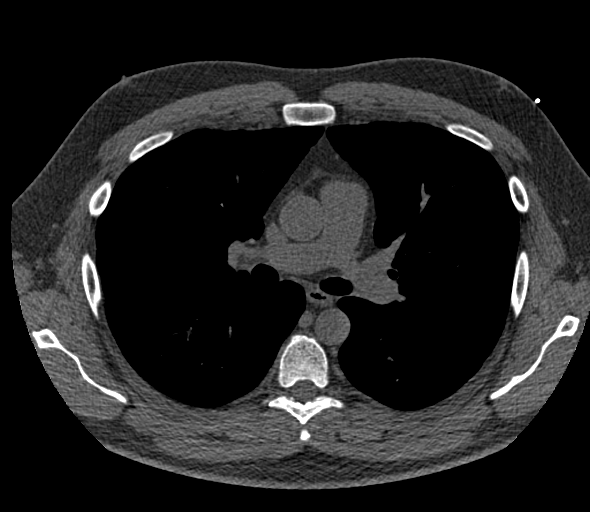
[im 61/73  lung]
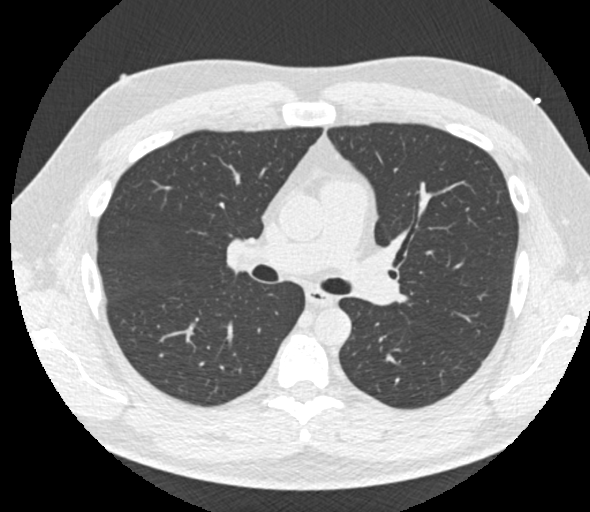

[Series 9: calcium scoring 2.00 br60 bestdiast 70% lungs · axial · 0.66mm/px · z∈[+1579,+1675]mm · 5 of 73 slices shown]
[im 13/73  vessel]
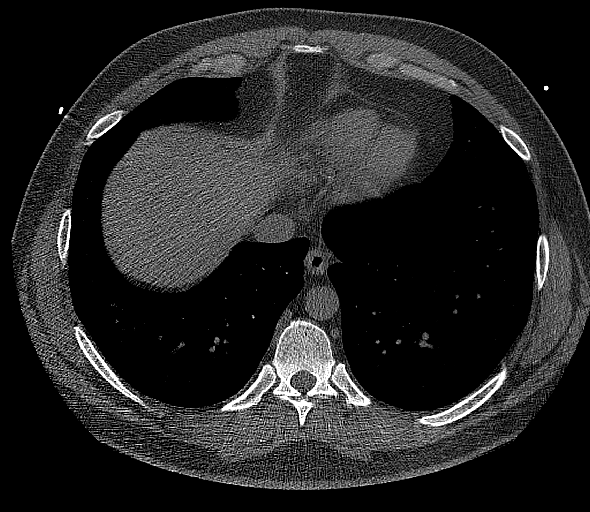
[im 25/73  vessel]
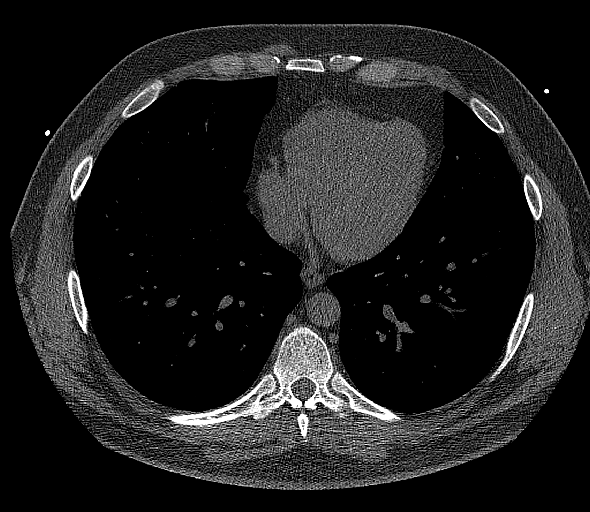
[im 37/73  vessel]
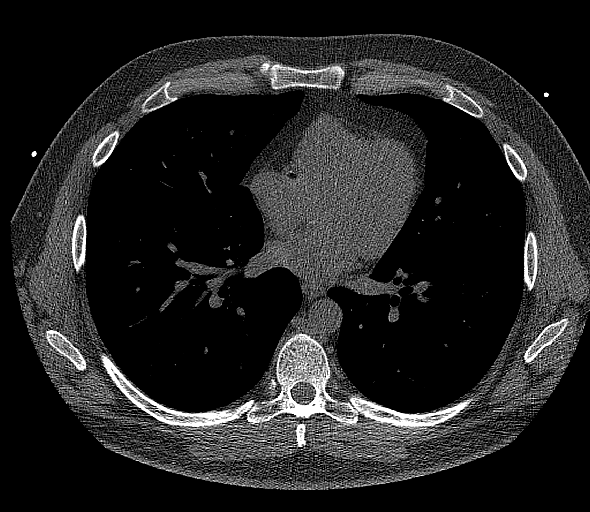
[im 49/73  vessel]
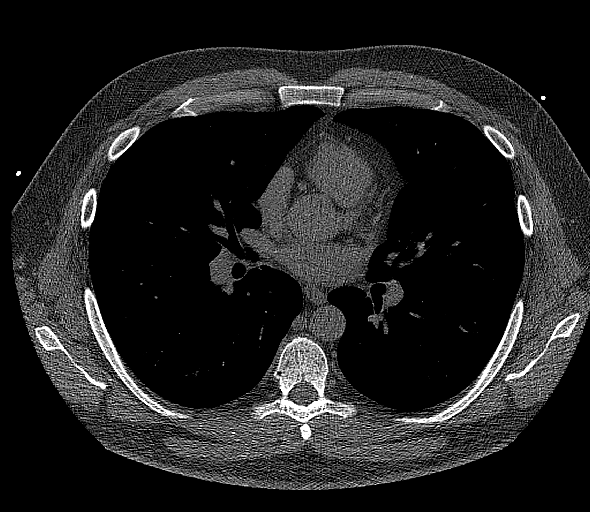
[im 61/73  vessel]
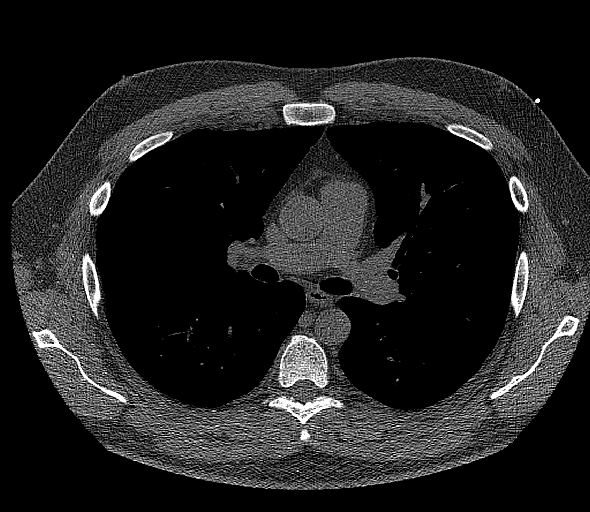

[14 of 20 positions shown; findings below may reference images not displayed]

FINDINGS: CORONARY CALCIUM SCORES:

Left Main: 0

LAD: 0

LCx: 0

RCA: 0

Total Agatston Score: 0

[HOSPITAL] percentile: 0

AORTA MEASUREMENTS:

Ascending Aorta: 30 mm

Descending Aorta: 25 mm

OTHER FINDINGS:

Evaluation of the right coronary artery is slightly limited due to
motion artifact but no significant coronary calcium in this area.
Heart size is normal. Normal appearance of the visualized
mediastinal structures. Normal appearance of the upper abdomen.
Probable tiny calcified granuloma in the right lower lobe on
sequence 9 image 53. Calcified granuloma in the left lower lobe
measures 3 mm on sequence 9, image 62. No significant consolidation
or airspace disease in the visualized lungs. No large pleural
effusions. No acute bone abnormality.
IMPRESSION: Coronary calcium score is 0.

## 2022-09-22 DIAGNOSIS — J301 Allergic rhinitis due to pollen: Secondary | ICD-10-CM | POA: Diagnosis not present

## 2022-12-01 DIAGNOSIS — N529 Male erectile dysfunction, unspecified: Secondary | ICD-10-CM | POA: Diagnosis not present

## 2022-12-01 DIAGNOSIS — J069 Acute upper respiratory infection, unspecified: Secondary | ICD-10-CM | POA: Diagnosis not present

## 2023-06-25 DIAGNOSIS — I10 Essential (primary) hypertension: Secondary | ICD-10-CM | POA: Diagnosis not present

## 2023-06-25 DIAGNOSIS — Z125 Encounter for screening for malignant neoplasm of prostate: Secondary | ICD-10-CM | POA: Diagnosis not present

## 2023-06-25 DIAGNOSIS — E7800 Pure hypercholesterolemia, unspecified: Secondary | ICD-10-CM | POA: Diagnosis not present

## 2023-06-25 DIAGNOSIS — Z0000 Encounter for general adult medical examination without abnormal findings: Secondary | ICD-10-CM | POA: Diagnosis not present

## 2023-07-02 DIAGNOSIS — Z0000 Encounter for general adult medical examination without abnormal findings: Secondary | ICD-10-CM | POA: Diagnosis not present

## 2023-07-04 DIAGNOSIS — R748 Abnormal levels of other serum enzymes: Secondary | ICD-10-CM | POA: Diagnosis not present

## 2023-07-04 DIAGNOSIS — R7989 Other specified abnormal findings of blood chemistry: Secondary | ICD-10-CM | POA: Diagnosis not present

## 2023-08-14 DIAGNOSIS — I10 Essential (primary) hypertension: Secondary | ICD-10-CM | POA: Diagnosis not present

## 2023-08-14 DIAGNOSIS — F39 Unspecified mood [affective] disorder: Secondary | ICD-10-CM | POA: Diagnosis not present

## 2023-08-14 DIAGNOSIS — F331 Major depressive disorder, recurrent, moderate: Secondary | ICD-10-CM | POA: Diagnosis not present

## 2023-08-14 DIAGNOSIS — F419 Anxiety disorder, unspecified: Secondary | ICD-10-CM | POA: Diagnosis not present

## 2023-08-28 DIAGNOSIS — D225 Melanocytic nevi of trunk: Secondary | ICD-10-CM | POA: Diagnosis not present

## 2023-08-28 DIAGNOSIS — Z1283 Encounter for screening for malignant neoplasm of skin: Secondary | ICD-10-CM | POA: Diagnosis not present

## 2023-08-28 DIAGNOSIS — X32XXXA Exposure to sunlight, initial encounter: Secondary | ICD-10-CM | POA: Diagnosis not present

## 2023-08-28 DIAGNOSIS — L570 Actinic keratosis: Secondary | ICD-10-CM | POA: Diagnosis not present

## 2023-11-12 DIAGNOSIS — F331 Major depressive disorder, recurrent, moderate: Secondary | ICD-10-CM | POA: Diagnosis not present

## 2023-11-12 DIAGNOSIS — F419 Anxiety disorder, unspecified: Secondary | ICD-10-CM | POA: Diagnosis not present
